# Patient Record
Sex: Female | Born: 1945 | Race: White | Hispanic: No | Marital: Married
Health system: Southern US, Community
[De-identification: ages and names within clinical notes are randomized; demographics above are authoritative.]

---

## 2008-02-08 ENCOUNTER — Inpatient Hospital Stay (HOSPITAL_COMMUNITY): Admission: RE | Admit: 2008-02-08 | Discharge: 2008-02-10 | Payer: Self-pay | Admitting: Orthopedic Surgery

## 2008-05-24 ENCOUNTER — Ambulatory Visit: Admission: RE | Admit: 2008-05-24 | Discharge: 2008-05-24 | Payer: Self-pay | Admitting: Orthopedic Surgery

## 2008-07-11 ENCOUNTER — Inpatient Hospital Stay (HOSPITAL_COMMUNITY): Admission: RE | Admit: 2008-07-11 | Discharge: 2008-07-13 | Payer: Self-pay | Admitting: Orthopedic Surgery

## 2008-09-18 IMAGING — CR DG CHEST 2V
2 series · 2 of 2 positions shown · non-contrast
Comparison: None.

CLINICAL DATA: Osteoarthritis of the left knee. Preop radiograph.
 CHEST - 2 VIEW:

[view not recorded (1 of 2)]
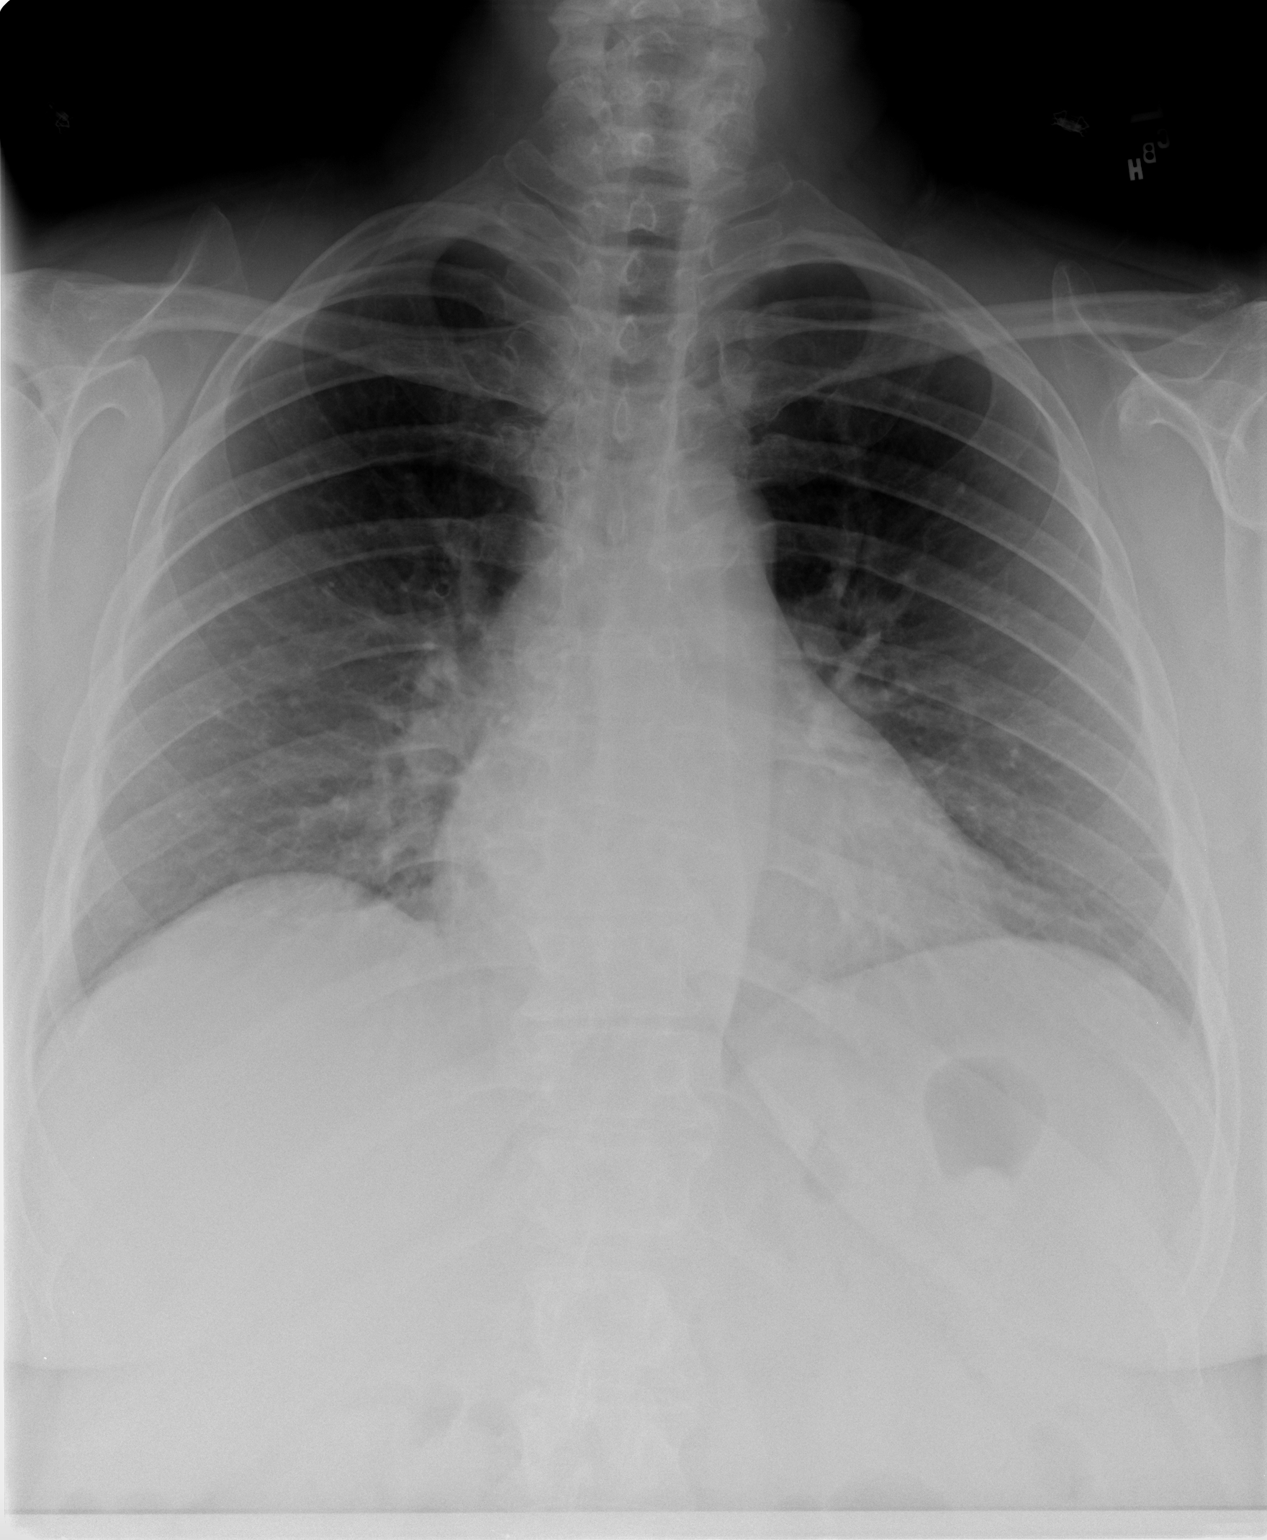

[view not recorded (2 of 2)]
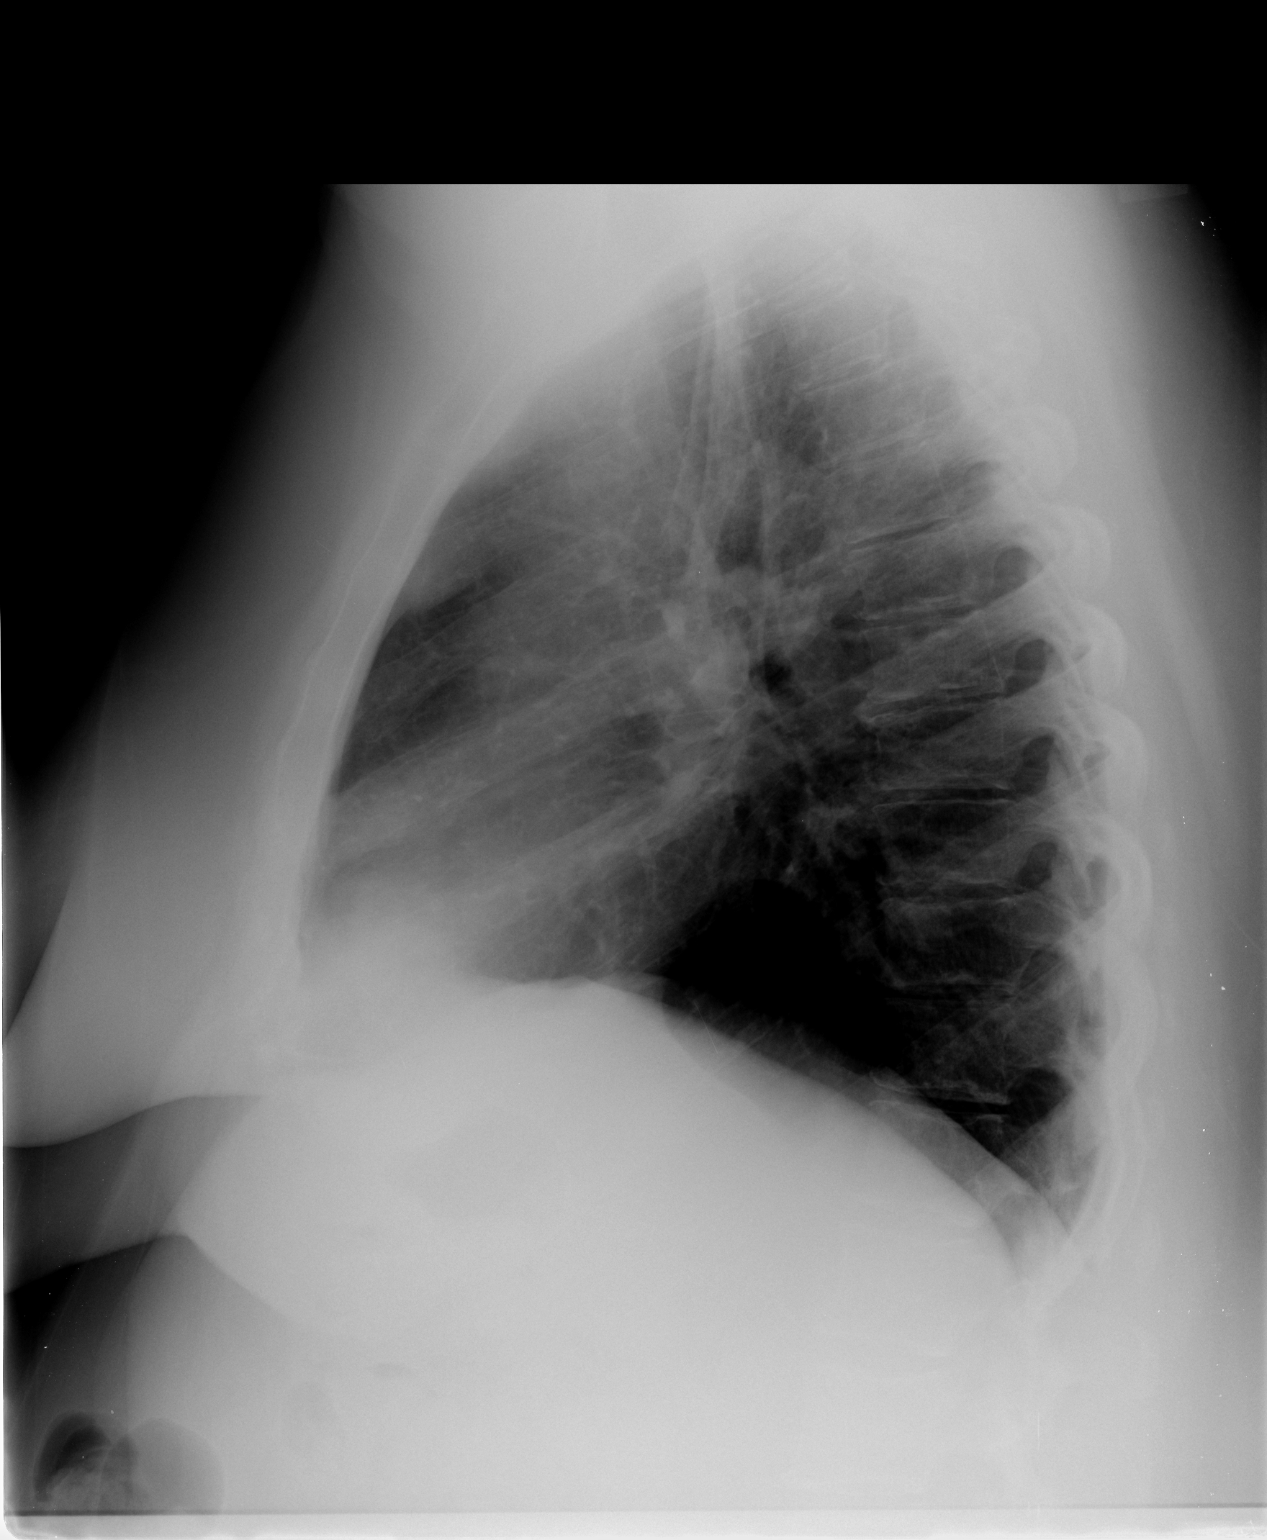

[2 of 2 positions shown; findings below may reference images not displayed]

FINDINGS: The heart size and mediastinal contours are unremarkable.  
 There is no pleural fluid or pulmonary edema.
 No airspace densities are identified.
IMPRESSION: No active disease.

## 2011-04-09 NOTE — Op Note (Signed)
NAMEKYNISHA, MEMON NO.:  [id number]   MEDICAL RECORD NO.:  [id number]          PATIENT TYPE:  INP   LOCATION:  2899                         FACILITY:  [facility]   PHYSICIAN:  Mila Homer. Sherlean Foot, M.D. DATE OF BIRTH:  May 05, 1946   DATE OF PROCEDURE:  02/08/2008  DATE OF DISCHARGE:                               OPERATIVE REPORT   SURGEON:  Mila Homer. Sherlean Foot, M.D.   ASSISTANT:  Legrand Pitts. Duffy, P.A.   ANESTHESIA:  General.   PREOPERATIVE DIAGNOSIS:  Left knee osteoarthritis.   POSTOPERATIVE DIAGNOSIS:  Left knee osteoarthritis.   PROCEDURE:  Left total knee arthroplasty.   INDICATIONS FOR PROCEDURE:  The patient is 65 year old white female with  failure of conservative measures for osteoarthritis of the left knee.  Informed consent was obtained.   DESCRIPTION OF PROCEDURE:  The patient was laid supine and administered  general anesthesia.  After sterile prep and drape, the extremity was  exsanguinated with an Esmarch and the tourniquet inflated to 350 mmHg.   A #10 blade was used to make a midline incision.  A new blade was used  to make a median parapatellar arthrotomy and perform a synovectomy.  I  everted the patella, and I resected 9 mm off.  I drilled 3 lug holes,  and with a 32-mm trial, I recreated the original thickness.  I then went  into flexion, subluxing the patella laterally.  I used the  extramedullary alignment system on the tibia, intramedullary alignment  system on the femur, making a perpendicular cut to the anatomic axis of  the tibia and 60-degree valgus cut to the anatomic axis of the femur.  I  sized to a size E and pinned to 3-degree external rotation holes in the  femur and used four-in-one cutting block and made the anterior,  posterior, and chamfer cuts.  I then placed a laminar spreader in the  knee and removed the ACL, PCL, medial and lateral menisci, posterior  condylar osteophytes.  Then I trialed with a 12 and 14 spacer block.  14  gave good flexion/extension gap balance.  I then finished the femur with  a size E finishing block, finished the tibia size 3 tibial tray drilling  keel.  I then trialed with a 3 tibia, E femur, 14 insert, 32 patella and  had good flexion/extension gap balance, dropping the angle back to 110  degrees limited only by the thigh/calf ratio.  I then removed the  components and copiously irrigated.  I then cemented in the components,  removing excess cement.  I then let the tourniquet down and obtained  hemostasis.  I then copiously irrigated again.  I then left the Hemovac  coming out superolaterally and deep arthrotomy pain catheter coming out  supermedial and superficial to the arthrotomy.  I then closed the  arthrotomy with figure-of-eight #1 Vicryl sutures, the deep subcutaneous  tissues with buried 0 Vicryl sutures, subcuticular 3-0 Vicryl stitch,  and skin staples.  I then dressed with Xeroform dressing, sponges,  sterile Webril, and TED stocking.   COMPLICATIONS:  None.   DRAINS:  One Hemovac.  One pain catheter.   ESTIMATED BLOOD LOSS:  300 mL.   TOURNIQUET TIME:  50 minutes.           ______________________________  Mila Homer Sherlean Foot, M.D.     SDL/MEDQ  D:  02/08/2008  T:  02/08/2008  Job:  [id number]

## 2011-04-09 NOTE — Op Note (Signed)
[name], [name] NO.:  [id number]   MEDICAL RECORD NO.:  [id number]          PATIENT TYPE:  INP   LOCATION:  5019                         FACILITY:  [facility]   PHYSICIAN:  Mila Homer. Sherlean Foot, M.D. DATE OF BIRTH:  [birth year]   DATE OF PROCEDURE:  07/11/2008  DATE OF DISCHARGE:                               OPERATIVE REPORT   SURGEON:  Mila Homer. Sherlean Foot, MD   ASSISTANTS:  1. Altamese Cabal, PA-C  2. Skip Mayer, PA   ANESTHESIA:  General.   PREOPERATIVE DIAGNOSIS:  Right knee osteoarthritis.   POSTOPERATIVE DIAGNOSIS:  Right knee osteoarthritis.   PROCEDURE:  Right total knee arthroplasty.   INDICATIONS FOR PROCEDURE:  The patient is a 65 year old white female  with failure of conservative measures for right knee osteoarthritis and  a good outcome, status post left knee TKA.  Informed consent obtained.   DESCRIPTION OF PROCEDURE:  The patient was taken to the operating room,  administered general anesthesia, Foley catheter placement, supine  position.  Right leg was prepped and draped in the usual sterile  fashion.  The extremity was exsanguinated with an Esmarch and tourniquet  inflated to 350 mmHg and set for 1 hour.  After exsanguination, a  midline incision was made with a #10 blade.  Approximately 8 inches in  length.  Using new blade to make median parapatellar arthrotomy and  performed synovectomy.  I elevated the deep MCL off the medial crest of  the tibia and I did a little bit of releasing there since this was a  varus knee.  I everted the patella and measured 21-mm thick.  I reamed  down 9 mm, drilled 3 lug holes with a 32-mm prosthetic trial in place,  had recreated 21-mm thickness.  I removed the prosthetic trial, went  into flexion, subluxing patella laterally.  I used the extramedullary  alignment system on the tibia to make perpendicular cut to the anatomic  axis.  We used the intramedullary alignment system on the femur to make  a  6-degree valgus cut being a varus knee.  I then marked out the  epicondylar axis and posterior condylar angle measured 3 degrees.  Sized  to a size E and pinned the formal cutting block into place for size E, I  made the anterior, posterior, and chamfer cuts with a sagittal saw.  We  put a lamina spreader in the knee and removed the ACL, PCL, medial  lateral menisci, and posterior condylar osteophytes.  I used a 12-mm  spacer block and obtained good flexion/extension gap balance.  I then  placed the scoop retractor under the femur, finished the femur with a  size E finishing block, drilling for the large cut into the box.  Finished the tibia with a size 3 tibial tray drilling keel.  I then  trialed with 3 tibia, E femur, 12 insert, 32 patella, and had a good  patellar tracking, excellent flexion/extension gap balance, dropping  angle back to 120 degrees limited only by the thigh-calf ratio.  I  removed the trial components and copiously irrigated.  I  then cemented  the components, removed all excess cement, allowing the cement to harden  in extension.  I left a Hemovac coming out superolaterally in the leg  and deep to the arthrotomy.  A pain catheter coming out superomedially  and superficially to the arthrotomy.  I obtained hemostasis and  copiously irrigated.  I then closed the arthrotomy with figure-of-eight  #1 Vicryl sutures, closed the deep soft tissues with buried 0 Vicryl  sutures and subcuticular 2-0 Vicryl sutures, and the skin with staples.  Dressed with Xeroform, dry sponges, sterile Webril, and TED stocking.   TOURNIQUET TIME:  50 minutes.   DRAINS:  One Hemovac and one pain catheter.   ESTIMATED BLOOD LOSS:  300 mL.           ______________________________  Mila Homer. Sherlean Foot, M.D.     SDL/MEDQ  D:  07/11/2008  T:  07/12/2008  Job:  [id number]

## 2011-04-12 NOTE — Discharge Summary (Signed)
[name], [name] NO.:  [id number]   MEDICAL RECORD NO.:  [id number]          PATIENT TYPE:  INP   LOCATION:  5014                         FACILITY:  [facility]   PHYSICIAN:  Mila Homer. Sherlean Foot, M.D. DATE OF BIRTH:  1946-01-05   DATE OF ADMISSION:  02/08/2008  DATE OF DISCHARGE:  02/10/2008                               DISCHARGE SUMMARY   ADMISSION DIAGNOSES:  1. End-stage osteoarthritis left knee.  2. Obesity.  3. Diabetes mellitus.  4. Hypertension.  5. History of colon cancer.   DISCHARGE DIAGNOSES:  1. End-stage osteoarthritis left knee, status post left total knee      arthroplasty.  2. Acute blood loss anemia secondary to surgery.  3. Hypokalemia.  4. Constipation.  5. Pyrexia.  6. Obesity.  7. Diabetes mellitus.  8. Hypertension.  9. History of colon cancer.   SURGICAL PROCEDURES:  On February 08, 2008, Ms. Barsch underwent a left  total knee arthroplasty by Dr. Raymon Mutton assisted by Arnoldo Morale,  PA-C.  She had a NexGen legacy knee LPS articular surface, size striped  LEF 14 mm height placed with an all poly patella size 30-8.5 mm  thickness standard size.  A NexGen fluted stem tibial component size 3  with a NexGen legacy knee LPS femoral component size E left.   COMPLICATIONS:  None.   CONSULTANT:  Physical therapy and occupational therapy consult February 09, 2008.   HISTORY OF PRESENT ILLNESS:  This 65 year old female presented to Dr.  Sherlean Foot with a history of OA of both knees.  She has been treated in the  past by another doctor.  She has severe OA, left worse than right.  Left  knee pain is now constant.  It is more of an aching sensation.  She has  failed conservative treatment.  She cannot take oral anti-inflammatories  due to GI issues.  X-rays show end-stage arthritic changes of the left  knee.  Because of this, she is presenting for a left knee replacement.   HOSPITAL COURSE:  Ms. Reither tolerated her surgical procedure well  without immediate postoperative complications.  She was transferred to  5000.  On postoperative day 1, T-max was 100, vitals were stable,  hemoglobin 11.5, hematocrit 34.1, white count elevated at 12.7, and CBGs  ranged from 184-224.  Leg was neurovascularly intact.  Pain was  controlled with medications.  She was weaned off her oxygen.  Started on  therapy per protocol.  Her metformin was resumed.   On postoperative day 2, she had done fantastically well with physical  therapy the day before.  She was able to void that morning once her  Foley had been discontinued.  Her T-max was 101.3.  Vitals were stable.  White count was 12.6, hemoglobin 10.5, hematocrit 30.9.  Hemoglobin A1c  was elevated at 9.2%.  Her CBGs were ranging from 205-256.  Her dressing  was changed.  Marcaine pump and Hemovac discontinued.  Incision was well-  approximated with staples.  She had some constipation which was treated  with a laxative.  She was switched to p.o.  pain medications.  She  remained afebrile that day.  It was felt by the end of the day that she  was moving well enough with therapy that she was ready for discharge  home and was discharged home later that day.   DISCHARGE INSTRUCTIONS:   DIET:  She is to resume her regular diabetic diet.   MEDICATIONS:  She may resume her home medications as follows.  1. Caduet 10/40 one tablet p.o. q.p.m.  2. Singulair 10 mg 1 tablet p.o. q.p.m.  3. Bisoprolol/hydrochlorothiazide 5/25 mg 1 tablet p.o. b.i.d.  4. Aspirin 81 mg 2 p.o. q.a.m.  She is to hold at this point until she      has completed her Lovenox and she may resume this.  5. Lantus 80 units subcu q.p.m..  6. Glipizide/metformin 2.5/250 mg 2 tablets p.o. b.i.d. at lunch and      dinner.  7. NovoLog insulin 10 units subcu q. meals.   Additional medications at this time include,  1. Lovenox 40 mg subcu q.a.m. with the last dose on February 22, 2008,      #12 with no refill.  2. Percocet 5/325 one to  two tablets p.o. q.4 h p.r.n. for pain, #60      with no refill.  3. Robaxin 500 mg 1-2 tablets p.o. q.6 h p.r.n. for spasms, #60 with      no refill.   ACTIVITY:  She can be out of bed weightbearing as tolerated on the left  leg with the use of a walker.  She is to have no lifting or driving for  6 weeks.  Home health PT per Genevieve Norlander, home health care, and home CPM 0-  90 degrees 6-8 hours a day.  Please see the blue total knee discharge  sheet for further activity instructions.   WOUND CARE:  She may shower if no drainage from the wound for 2 days.  Please see the blue total knee discharge sheet for further wound care  instructions.   FOLLOWUP:  She is to follow up with Dr. Sherlean Foot in our office on February 23, 2008, and needs to call [phone] for that appointment.   LABORATORY DATA:  White count ranged from 13.4 on March 04, 2008 to 12.6  on March 12, 2008.  Hemoglobin ranged from 14.1 with hematocrit of 41.8  on March 04, 2008 to 10.5 and 30.9 on March 12, 2008.  Platelets  remained within normal limits.   Potassium went from 4.1 on March 04, 2008 to 3.4 on March 12, 2008.  CO2  went from 29 on March 04, 2008 to 33 on March 12, 2008.  Glucose ranged  from 118 on March 04, 2008 to 248 on March 11, 2008.   Hemoglobin A1c on March 11, 2008 was 9.2%.  All other laboratory studies  were within normal limits.      Legrand Pitts Duffy, P.A.    ______________________________  Mila Homer. Sherlean Foot, M.D.    KED/MEDQ  D:  03/14/2008  T:  03/15/2008  Job:  [id number]

## 2011-04-12 NOTE — Discharge Summary (Signed)
Katelyn [name], MASIN NO.:  [id number]   MEDICAL RECORD NO.:  [id number]          PATIENT TYPE:  INP   LOCATION:  5019                         FACILITY:  [facility]   PHYSICIAN:  Mila Homer. Sherlean Foot, M.D. DATE OF BIRTH:  Jan 12, 1946   DATE OF ADMISSION:  07/11/2008  DATE OF DISCHARGE:  07/13/2008                               DISCHARGE SUMMARY   ADMISSION DIAGNOSIS:  Osteoarthritis in the right knee, end-stage.   DISCHARGE DIAGNOSES:  1. Osteoarthritis in the right knee status post right total knee      arthroscopy.  2. Diabetes type 2.  3. Hypertension.  4. History of colon malignancy.  5. Asthma.  6. Obesity.   PROCEDURE:  Right total knee arthroscopy.   HISTORY:  The patient is a 65 year old female with bilateral knee pain  for several years status post past surgical history for left TKA on  March 2009.  Pain was worse in the right knee.  The patient failed all  conservative measures and now wishes to proceed with right TKA.  The  risks and benefits of surgery were discussed.  The surgery was  originally scheduled for July, but had trouble related to hypertension  meds; she however as inpatient, the patient is cleared for surgery.   HOSPITAL COURSE:  On the day of surgery, the 65 year old female was  admitted on July 11, 2008, after appropriate laboratory studies were  obtained as well as Ancef IV on call to the operating room, she was  taken to the operating room where she underwent a right total knee  arthroscopy.  She tolerated the procedure well.  She was placed on a  Dilaudid PCA pain pump reduced-dose protocol.  She was started on  Lovenox 30 mg subcu q.12 h. beginning July 12, 2008, at 8 a.m.  Consults to PT, OT and Care Management were made with weightbearing as  tolerated.  CPM 0-90 degrees for 6-8 hours per day.  Foley was placed  intraoperatively.  She was allowed out of bed the following day.  On  postop day 1, the patient was up with PT.  Her  vital signs were stable.  She was alert and oriented x3.  Her wound was clean, dry, and intact  with a small amount of serosanguineous drainage.  She was soft and  nontender with a negative Homans.  CPM was 0-90.  She had regular rate  and rhythm.  Her lungs were clear bilaterally to auscultation, and she  had positive bowel sounds.  Her lower extremity was neurovascularly  intact.  The patient did have a little trouble with quad strength and  weakness on PT on July 12, 2008.  On postop day #2, the patient's pain  had decreased.  Her vital signs were stable.  Her wounds dressing was  changed.  Marcaine pump was discontinued.  Her Hemovac was discontinued.  Her Foley was discontinued as well as her PCA.  She has a negative for  Homans.  Her lower leg was still neurovascularly intact.  The patient  was discharged on that day.   Laboratory studies on  the date of admission:  Her white blood was 10.3,  her H and H was 12.9 and 38.4, and her platelets were 298.  Her routine  chemistry; sodium of 139, potassium of 4.5, chloride was 103, CO2 of 28,  glucose was 315, BUN was 17, and creatinine was 0.88.  When we received  the preop labs with a glucose of 315, I discovered that Ms. Katelyn [name] had  gone to the [facility] before going to get her prelabs done  and had not taken her insulin that morning, which would explain the high  glucose.  On the date of discharge, her white blood cell count was 10.6,  H and H was 9.1, and 27.0, and platelets of 192.  Sodium was 135,  potassium was 4.1, chloride was 98, CO2 was 30, glucose had normalized  to 136, BUN was 13, and creatinine was 0.91.   DISCHARGE INSTRUCTIONS:  The patient is on her regular diabetic-  restricted diet.  She is to follow the [facility] Sheet Instructions for her  wound care, increase her activity slowly, and may use a walker.  Weightbearing as tolerated.  She is receiving home health from [place]  and no lifting or driving x6  weeks.  Prescriptions for Lovenox 100 mg  inject 1 subcu daily, last dose being July 25, 2008; Robaxin 500 mg 1  tablet every 6-8 hours as needed for pain; and Percocet 5/325 one tablet  4-6 hours as needed for pain.  Started Robaxin for spasms.  Other  medications that the patient should continue are her glipizide/metformin  hydrochlorothiazide 2.5/250 mg daily, simvastatin tablet 40 mg daily,  Singulair tablet 10 mg daily, Claritin as needed, NovoLog 10 units 3  times a day daily, Lantus 8 units once daily in the evening, lisinopril  40 mg once a day, and also labetalol/hydrochlorothiazide 5/6.25 mg  daily.  She will follow up with Dr. Sherlean Foot on July 26, 2008.  The  patient is to call for that appointment.  She was discharged in improved  condition.  Telephone number to call is [phone].     ______________________________  Altamese Cabal, PA-C    ______________________________  Mila Homer. Sherlean Foot, M.D.    MJ/MEDQ  D:  08/19/2008  T:  08/20/2008  Job:  [id number]

## 2011-08-19 LAB — URINALYSIS, ROUTINE W REFLEX MICROSCOPIC
Bilirubin Urine: NEGATIVE
Glucose, UA: NEGATIVE
Ketones, ur: NEGATIVE
Nitrite: NEGATIVE
Specific Gravity, Urine: 1.021
pH: 5

## 2011-08-19 LAB — BASIC METABOLIC PANEL
BUN: 6
CO2: 31
CO2: 33 — ABNORMAL HIGH
Calcium: 8.6
Chloride: 96
Chloride: 96
Creatinine, Ser: 0.58
GFR calc Af Amer: >60
Glucose, Bld: 187 — ABNORMAL HIGH
Glucose, Bld: 248 — ABNORMAL HIGH
Potassium: 3.4 — ABNORMAL LOW
Sodium: 137

## 2011-08-19 LAB — COMPREHENSIVE METABOLIC PANEL
ALT: 23
AST: 20
Albumin: 3.8
Alkaline Phosphatase: 78
Chloride: 102
GFR calc Af Amer: >60
Potassium: 4.1
Sodium: 136
Total Bilirubin: 0.5
Total Protein: 7.1

## 2011-08-19 LAB — DIFFERENTIAL
Basophils Absolute: 0
Basophils Relative: 0
Eosinophils Relative: 1
Monocytes Absolute: 0.6
Monocytes Relative: 5

## 2011-08-19 LAB — CBC
HCT: 41.8
HCT: 30.9 — ABNORMAL LOW
HCT: 34.1 — ABNORMAL LOW
Hemoglobin: 11.5 — ABNORMAL LOW
MCHC: 33.7
MCHC: 33.8
MCV: 87.5
MCV: 88.1
Platelets: 342
Platelets: 239
RBC: 3.87
RDW: 14.6
RDW: 14.6
WBC: 13.4 — ABNORMAL HIGH
WBC: 12.6 — ABNORMAL HIGH

## 2011-08-19 LAB — TYPE AND SCREEN: Antibody Screen: NEGATIVE

## 2011-08-19 LAB — URINE CULTURE: Colony Count: 45000

## 2011-08-22 LAB — URINE CULTURE: Colony Count: >=100000

## 2011-08-22 LAB — DIFFERENTIAL
Eosinophils Relative: 2
Lymphocytes Relative: 27
Monocytes Absolute: 0.4
Monocytes Relative: 5
Neutro Abs: 5.3

## 2011-08-22 LAB — COMPREHENSIVE METABOLIC PANEL
AST: 14
Albumin: 3.5
Alkaline Phosphatase: 75
BUN: 15
Creatinine, Ser: 0.64
GFR calc Af Amer: >60
Potassium: 4.2
Total Protein: 6.3

## 2011-08-22 LAB — CBC
HCT: 37.3
Platelets: 317
RDW: 16.9 — ABNORMAL HIGH
WBC: 8

## 2011-08-22 LAB — CROSSMATCH

## 2011-08-22 LAB — APTT: aPTT: 31

## 2011-08-22 LAB — URINALYSIS, ROUTINE W REFLEX MICROSCOPIC
Nitrite: NEGATIVE
Specific Gravity, Urine: 1.012
pH: 6.5

## 2011-08-23 LAB — DIFFERENTIAL
Basophils Relative: 0
Eosinophils Relative: 2
Monocytes Absolute: 0.5
Monocytes Relative: 5
Neutro Abs: 7.9 — ABNORMAL HIGH

## 2011-08-23 LAB — URINALYSIS, ROUTINE W REFLEX MICROSCOPIC
Bilirubin Urine: NEGATIVE
Nitrite: NEGATIVE
Specific Gravity, Urine: 1.017
pH: 6

## 2011-08-23 LAB — CROSSMATCH: ABO/RH(D): A POS

## 2011-08-23 LAB — COMPREHENSIVE METABOLIC PANEL
Alkaline Phosphatase: 76
BUN: 17
Glucose, Bld: 315 — ABNORMAL HIGH
Potassium: 4.5
Total Protein: 6

## 2011-08-23 LAB — URINE MICROSCOPIC-ADD ON

## 2011-08-23 LAB — CBC
HCT: 38.4
Hemoglobin: 12.9
MCHC: 33.5
MCV: 87
RDW: 16.1 — ABNORMAL HIGH

## 2011-08-23 LAB — APTT: aPTT: 31

## 2011-08-23 LAB — URINE CULTURE

## 2018-02-04 ENCOUNTER — Other Ambulatory Visit: Payer: Self-pay | Admitting: Orthopedic Surgery

## 2020-01-15 ENCOUNTER — Ambulatory Visit: Payer: Medicare Other | Attending: Internal Medicine

## 2020-01-15 DIAGNOSIS — Z23 Encounter for immunization: Secondary | ICD-10-CM | POA: Insufficient documentation

## 2020-01-15 NOTE — Progress Notes (Signed)
   Covid-19 Vaccination Clinic  Name:  Katelyn [name]    MRN: [id number] DOB: May 08, 1946  01/15/2020  [name] was observed post Covid-19 immunization for 15 minutes without incidence. She was provided with Vaccine Information Sheet and instruction to access the V-Safe system.   [name] was instructed to call 911 with any severe reactions post vaccine: Marland Kitchen Difficulty breathing  . Swelling of your face and throat  . A fast heartbeat  . A bad rash all over your body  . Dizziness and weakness    Immunizations Administered    Name Date Dose VIS Date Route   Pfizer COVID-19 Vaccine 01/15/2020  3:12 PM 0.3 mL 11/05/2019 Intramuscular   Manufacturer: [employer], [employer]   Lot: [id number]   NDC: [address]-3

## 2020-02-08 ENCOUNTER — Ambulatory Visit: Payer: Self-pay | Attending: Internal Medicine

## 2020-02-08 DIAGNOSIS — Z23 Encounter for immunization: Secondary | ICD-10-CM

## 2020-02-08 NOTE — Progress Notes (Signed)
   Covid-19 Vaccination Clinic  Name:  Katelyn [name]    MRN: [id number] DOB: 1946-10-12  02/08/2020  Katelyn [name] was observed post Covid-19 immunization for 15 minutes without incident. She was provided with Vaccine Information Sheet and instruction to access the V-Safe system.   Katelyn [name] was instructed to call 911 with any severe reactions post vaccine: Marland Kitchen Difficulty breathing  . Swelling of face and throat  . A fast heartbeat  . A bad rash all over body  . Dizziness and weakness   Immunizations Administered    Name Date Dose VIS Date Route   Pfizer COVID-19 Vaccine 02/08/2020  1:51 PM 0.3 mL 11/05/2019 Intramuscular   Manufacturer: [employer], [employer]   Lot: [id number]   NDC: [address]-5
